# Patient Record
Sex: Female | Born: 1950 | Race: White | Hispanic: No
Health system: Midwestern US, Academic
[De-identification: ages and names within clinical notes are randomized; demographics above are authoritative.]

---

## 2016-09-14 LAB — LIPID PROFILE: Lab: 195

## 2016-09-14 LAB — COMPREHENSIVE METABOLIC PANEL: Lab: 140

## 2016-09-14 LAB — THYROID STIMULATING HORMONE-TSH: Lab: 2.3

## 2016-09-14 LAB — HEMOGLOBIN A1C: Lab: 7.9 — ABNORMAL HIGH (ref 4.5–6.5)

## 2016-11-13 LAB — BNP (B-TYPE NATRIURETIC PEPTI): Lab: 85 — ABNORMAL HIGH (ref 80–115)

## 2016-11-13 LAB — TROPONIN-I

## 2016-11-13 LAB — COMPREHENSIVE METABOLIC PANEL
Lab: 139 — ABNORMAL HIGH (ref <100)
Lab: 21 — ABNORMAL HIGH (ref 0–14)

## 2016-11-14 ENCOUNTER — Encounter: Admit: 2016-11-14 | Discharge: 2016-11-14 | Payer: MEDICARE

## 2016-11-14 ENCOUNTER — Ambulatory Visit: Admit: 2016-11-14 | Discharge: 2016-11-15 | Payer: MEDICARE

## 2016-11-14 DIAGNOSIS — I4891 Unspecified atrial fibrillation: Principal | ICD-10-CM

## 2016-11-16 ENCOUNTER — Encounter: Admit: 2016-11-16 | Discharge: 2016-11-16 | Payer: MEDICARE

## 2016-11-16 LAB — BASIC METABOLIC PANEL
Lab: 1.6 — ABNORMAL HIGH (ref 0.57–1.11)
Lab: 10 — ABNORMAL HIGH (ref 8.4–10.2)
Lab: 106 — ABNORMAL HIGH (ref 0.57–1.11)
Lab: 141 — ABNORMAL LOW (ref 23–31)
Lab: 18 — ABNORMAL HIGH (ref 0–14)
Lab: 184 — ABNORMAL HIGH (ref 80–115)
Lab: 21 — ABNORMAL LOW (ref 23–31)
Lab: 31 — ABNORMAL HIGH (ref 9.8–20.1)
Lab: 4.1 — ABNORMAL HIGH (ref 9.8–20.1)

## 2016-11-16 LAB — CBC: Lab: 6.5

## 2016-11-28 ENCOUNTER — Encounter: Admit: 2016-11-28 | Discharge: 2016-11-28 | Payer: MEDICARE

## 2016-12-05 ENCOUNTER — Encounter: Admit: 2016-12-05 | Discharge: 2016-12-05 | Payer: MEDICARE

## 2016-12-07 ENCOUNTER — Encounter: Admit: 2016-12-07 | Discharge: 2016-12-07 | Payer: MEDICARE

## 2016-12-07 DIAGNOSIS — Z8679 Personal history of other diseases of the circulatory system: ICD-10-CM

## 2016-12-07 DIAGNOSIS — E119 Type 2 diabetes mellitus without complications: Principal | ICD-10-CM

## 2016-12-07 DIAGNOSIS — I10 Essential (primary) hypertension: ICD-10-CM

## 2016-12-07 DIAGNOSIS — Z8673 Personal history of transient ischemic attack (TIA), and cerebral infarction without residual deficits: ICD-10-CM

## 2016-12-14 ENCOUNTER — Encounter: Admit: 2016-12-14 | Discharge: 2016-12-14 | Payer: MEDICARE

## 2016-12-14 ENCOUNTER — Ambulatory Visit: Admit: 2016-12-14 | Discharge: 2016-12-15 | Payer: MEDICARE

## 2016-12-14 DIAGNOSIS — Z8679 Personal history of other diseases of the circulatory system: ICD-10-CM

## 2016-12-14 DIAGNOSIS — Z8673 Personal history of transient ischemic attack (TIA), and cerebral infarction without residual deficits: ICD-10-CM

## 2016-12-14 DIAGNOSIS — E119 Type 2 diabetes mellitus without complications: Principal | ICD-10-CM

## 2016-12-14 DIAGNOSIS — I483 Typical atrial flutter: Principal | ICD-10-CM

## 2016-12-14 DIAGNOSIS — I10 Essential (primary) hypertension: ICD-10-CM

## 2016-12-14 DIAGNOSIS — I4892 Unspecified atrial flutter: ICD-10-CM

## 2016-12-14 MED ORDER — AMIODARONE 200 MG PO TAB
ORAL_TABLET | Freq: Every day | ORAL | 3 refills | 42.00000 days | Status: AC
Start: 2016-12-14 — End: ?

## 2016-12-14 NOTE — Assessment & Plan Note
Her tachycardia is entirely asymptomatic but should not be allowed to persist because it will eventually result in left ventricular dysfunction.  She could be started on oral amiodarone at this point but I will need to use a cardiac monitor to be able to determine what her rhythm is over the next couple of weeks.  Fortunately, she seems to be very compliant with her medication administration and does not seem to have missed any doses of anticoagulation.

## 2016-12-14 NOTE — Progress Notes
Date of Service: 12/14/2016    Audrey Hancock is a 66 y.o. female.       HPI     Audrey Hancock was in the [place] office today with a friend who helps somewhat as a caregiver.  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  Audrey Hancock's friends indicates that the patient is very compliant about taking all her medications as prescribed.    I saw Audrey Hancock while she was hospitalized at [facility] last month for paroxysmal atrial fibrillation/flutter.  She was entirely asymptomatic with the dysrhythmia and it had been picked up during a routine office visit.  She was started on oral anticoagulation at that time and her diltiazem dosage was increased somewhat.    Since that time, Audrey Hancock says she has been doing fine she denies any problems with lightheadedness or palpitations.  She had no chest discomfort or breathlessness.  She denies peripheral edema.  Her appetite has been good and she sleeps soundly.  She is entirely unaware of the fact that her heart rate is 160 today.         Vitals:    12/14/16 0906   BP: 92/66   Pulse: (!) 161   Weight: 63.1 kg (139 lb 3.2 oz)   Height: 1.6 m (5' 3)     Body mass index is 24.66 kg/m???.     Past Medical History  Patient Active Problem List    Diagnosis Date Noted   ??? Paroxysmal atrial flutter (HCC) 12/07/2016     11/14/2016  Echo : LV function is normal. EF~ 55%. Aortic valve sclerosis without stenosis; mild AI noted. No significant pericardial effusion. Normal ventricular chamber dimensions. Left atrial dilation. Normal LV wall thickness.  Abnormal diastolic function. Normal aortic root dimensions.  Estimated peak systolic PA pressure =  20 mmHg      ??? DM (diabetes mellitus) (HCC) 12/07/2016   ??? Hypertension 12/07/2016   ??? 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 History of CVA (cerebrovascular accident) 12/07/2016         Review of Systems Constitution: Positive for weakness.   HENT: Negative.    Eyes: Negative.    Cardiovascular: Positive for claudication.   Respiratory: Negative.    Endocrine: Negative.    Hematologic/Lymphatic: Negative.    Skin: Negative.    Musculoskeletal: Positive for falls.   Gastrointestinal: Negative.    Genitourinary: Negative.    Neurological: Positive for loss of balance.   Psychiatric/Behavioral: Negative.    Allergic/Immunologic: Negative.        Physical Exam    Physical Exam   General Appearance: no distress   Skin: warm, no ulcers or xanthomas   Digits and Nails: no cyanosis or clubbing   Eyes: conjunctivae and lids normal, pupils are equal and round   Teeth/Gums/Palate: dentition unremarkable, no lesions   Lips & Oral Mucosa: no pallor or cyanosis   Neck Veins: normal JVP , neck veins are not distended   Thyroid: no nodules, masses, tenderness or enlargement   Chest Inspection: chest is normal in appearance   Respiratory Effort: breathing comfortably, no respiratory distress   Auscultation/Percussion: lungs clear to auscultation, no rales or rhonchi, no wheezing   PMI: PMI not enlarged or displaced   Cardiac Rhythm: regular rhythm and fast rate   Cardiac Auscultation: S1, S2 normal, no rub, no gallop   Murmurs: no murmur  Peripheral Circulation: normal peripheral circulation   Carotid Arteries: normal carotid upstroke bilaterally, no bruits   Radial Arteries: normal symmetric radial pulses   Abdominal Aorta: no abdominal aortic bruit   Pedal Pulses: normal symmetric pedal pulses   Lower Extremity Edema: no lower extremity edema   Abdominal Exam: soft, non-tender, no masses, bowel sounds normal   Liver & Spleen: no organomegaly   Gait & Station: walks without assistance   Muscle Strength: normal muscle tone   Orientation: oriented to time, place and person   Affect & Mood: appropriate and sustained affect   Language and Memory: patient responsive and seems to comprehend information Neurologic Exam: neurological assessment grossly intact   Other: moves all extremities      Cardiovascular Studies    EKG:  Typical atrial flutter, ventricular rate 160.    Problems Addressed Today  Encounter Diagnoses   Name Primary?   ??? Typical atrial flutter (HCC) Yes   ??? Paroxysmal atrial flutter (HCC)        Assessment and Plan       Paroxysmal atrial flutter (HCC)  Her tachycardia is entirely asymptomatic but should not be allowed to persist because it will eventually result in left ventricular dysfunction.  She could be started on oral amiodarone at this point but I will need to use a cardiac monitor to be able to determine what her rhythm is over the next couple of weeks.  Fortunately, she seems to be very compliant with her medication administration and does not seem to have missed any doses of anticoagulation.      Current Medications (including today's revisions)  ??? amiodarone (CORDARONE) 200 mg tablet Take four tablets by mouth daily for 7 days, THEN three tablets daily for 7 days, THEN two tablets daily for 14 days, THEN one tablet daily. Take with food.   ??? apixaban (ELIQUIS) 5 mg tablet Take 5 mg by mouth twice daily.   ??? aspirin EC 81 mg tablet Take 81 mg by mouth daily. Take with food.   ??? canagliflozin (INVOKANA) 100 mg tablet Take 100 mg by mouth daily with breakfast.   ??? clotrimazole (LOTRIMIN) 1 % topical cream Apply 15 g topically to affected area twice daily.   ??? dapagliflozin 5 mg tab Take 1 tablet by mouth daily.   ??? diltiazem CD (CARDIZEM CD) 240 mg capsule Take 240 mg by mouth daily.   ??? famotidine (PEPCID) 20 mg tablet Take 20 mg by mouth twice daily.   ??? glipiZIDE (GLUCOTROL) 10 mg tablet Take 10 mg by mouth twice daily.   ??? levothyroxine (SYNTHROID) 25 mcg tablet Take 25 mcg by mouth daily 30 minutes before breakfast.   ??? loratadine (CLARITIN) 10 mg tablet Take 10 mg by mouth every morning.   ??? metFORMIN (GLUCOPHAGE) 1,000 mg tablet Take 1,000 mg by mouth twice daily with meals. ??? metoprolol XL (TOPROL XL) 50 mg extended release tablet Take 50 mg by mouth daily.   ??? MULTIVITAMIN PO Take 1 tablet by mouth daily.   ??? NIFEdipine SR (PROCARDIA-XL; ADALAT CC) 60 mg tablet Take 60 mg by mouth daily.   ??? nystatin (NYSTOP) 100,000 unit/g topical powder Apply  topically to affected area twice daily.   ??? ramipril (ALTACE) 2.5 mg capsule Take 2.5 mg by mouth daily.

## 2016-12-14 NOTE — Progress Notes
Holter Placement Record  Ordering Physician: Dr. Danella MaiersSteven Hancock  Diagnosis: Atrial Fibrillation/ Atrial Flutter  Brand:Ziopatch Irhythm  Length: 14 days  Ziopatch serial number: [id number]  Location where Holter was placed: [facility] [place] [facility]  Will Holter be returned by mail? Yes

## 2016-12-14 NOTE — Progress Notes
Enrollment Confirmed Irhythm

## 2016-12-28 ENCOUNTER — Encounter: Admit: 2016-12-28 | Discharge: 2016-12-28 | Payer: MEDICARE

## 2016-12-28 NOTE — Telephone Encounter
Pt called to report that her Ziopatch is to come off today and she has no ride to the office. Pt will mail it in.

## 2017-01-25 ENCOUNTER — Encounter: Admit: 2017-01-25 | Discharge: 2017-01-25 | Payer: MEDICARE

## 2017-02-20 ENCOUNTER — Encounter: Admit: 2017-02-20 | Discharge: 2017-02-20 | Payer: MEDICARE

## 2017-02-20 LAB — COMPREHENSIVE METABOLIC PANEL
Lab: 0.4
Lab: 12
Lab: 17
Lab: 17 — ABNORMAL HIGH (ref 0–14)
Lab: 25
Lab: 3 — ABNORMAL LOW (ref 3.4–4.8)
Lab: 9.5
Lab: 96
Lab: 135 — ABNORMAL LOW (ref 136–145)
Lab: 253 — ABNORMAL HIGH (ref 80–115)
Lab: 7.3 — ABNORMAL HIGH (ref 11.5–14.5)

## 2017-02-20 LAB — THYROID STIMULATING HORMONE-TSH: Lab: 6.6 — ABNORMAL HIGH (ref 0.35–4.94)

## 2017-02-20 LAB — FREE T4 (FREE THYROXINE) ONLY: Lab: 1.1 — ABNORMAL LOW (ref 23–31)

## 2017-02-20 LAB — BNP (B-TYPE NATRIURETIC PEPTI): Lab: 859 — ABNORMAL HIGH (ref 0–100)

## 2017-02-20 LAB — TROPONIN-I

## 2017-02-20 LAB — CBC: Lab: 11 — ABNORMAL HIGH (ref 4.8–10.8)

## 2017-02-24 LAB — CBC: Lab: 5.7

## 2017-02-25 LAB — MAGNESIUM: Lab: 1.6 — ABNORMAL LOW (ref 12.0–16.0)

## 2017-02-26 LAB — BASIC METABOLIC PANEL
Lab: 1.5 — ABNORMAL HIGH (ref 0.40–1.10)
Lab: 101 — ABNORMAL LOW (ref 35.0–45.0)
Lab: 138 — ABNORMAL LOW (ref 4.20–5.40)
Lab: 30 — ABNORMAL HIGH (ref 7–23)
Lab: 32 — ABNORMAL HIGH (ref 21–30)
Lab: 33 — ABNORMAL LOW (ref >60)
Lab: 39 — ABNORMAL LOW (ref >60)
Lab: 5
Lab: 78
Lab: 8.5 — ABNORMAL LOW (ref 9.0–10.7)

## 2017-02-28 ENCOUNTER — Encounter: Admit: 2017-02-28 | Discharge: 2017-02-28 | Payer: MEDICARE

## 2017-03-05 ENCOUNTER — Encounter: Admit: 2017-03-05 | Discharge: 2017-03-05 | Payer: MEDICARE

## 2017-03-05 DIAGNOSIS — Z79899 Other long term (current) drug therapy: ICD-10-CM

## 2017-03-05 DIAGNOSIS — Z8679 Personal history of other diseases of the circulatory system: ICD-10-CM

## 2017-03-05 DIAGNOSIS — I4892 Unspecified atrial flutter: Principal | ICD-10-CM

## 2017-03-19 LAB — COMPREHENSIVE METABOLIC PANEL
Lab: 0.4
Lab: 141 — ABNORMAL LOW (ref 4.20–5.40)
Lab: 16
Lab: 16 — ABNORMAL HIGH (ref 0–14)
Lab: 17
Lab: 3.2 — ABNORMAL LOW (ref 3.4–4.8)
Lab: 33
Lab: 6.4
Lab: 70

## 2017-03-19 LAB — FREE T4 (FREE THYROXINE) ONLY: Lab: 1.2

## 2017-03-19 LAB — LIPID PROFILE
Lab: 250 — ABNORMAL HIGH (ref 150–200)
Lab: 29 — ABNORMAL HIGH (ref 0.57–1.11)
Lab: 5

## 2017-03-19 LAB — THYROID STIMULATING HORMONE-TSH: Lab: 9.1 — ABNORMAL HIGH (ref 0.35–4.94)

## 2017-04-06 ENCOUNTER — Encounter: Admit: 2017-04-06 | Discharge: 2017-04-06 | Payer: MEDICARE

## 2017-04-30 LAB — CBC: Lab: 5

## 2017-05-14 LAB — THYROID STIMULATING HORMONE-TSH: Lab: 7.7 — ABNORMAL HIGH (ref 0.35–4.94)

## 2017-05-15 ENCOUNTER — Encounter: Admit: 2017-05-15 | Discharge: 2017-05-15 | Payer: MEDICARE

## 2017-05-31 ENCOUNTER — Encounter: Admit: 2017-05-31 | Discharge: 2017-05-31 | Payer: MEDICARE

## 2017-05-31 ENCOUNTER — Ambulatory Visit: Admit: 2017-05-31 | Discharge: 2017-06-01 | Payer: MEDICARE

## 2017-05-31 DIAGNOSIS — I4892 Unspecified atrial flutter: Principal | ICD-10-CM

## 2017-05-31 DIAGNOSIS — Z8673 Personal history of transient ischemic attack (TIA), and cerebral infarction without residual deficits: ICD-10-CM

## 2017-05-31 DIAGNOSIS — E119 Type 2 diabetes mellitus without complications: Principal | ICD-10-CM

## 2017-05-31 DIAGNOSIS — I10 Essential (primary) hypertension: ICD-10-CM

## 2017-05-31 DIAGNOSIS — Z8679 Personal history of other diseases of the circulatory system: ICD-10-CM

## 2017-11-02 ENCOUNTER — Encounter: Admit: 2017-11-02 | Discharge: 2017-11-02 | Payer: MEDICARE

## 2017-11-02 DIAGNOSIS — I4892 Unspecified atrial flutter: Principal | ICD-10-CM

## 2017-11-02 DIAGNOSIS — Z79899 Other long term (current) drug therapy: ICD-10-CM

## 2018-01-01 ENCOUNTER — Encounter: Admit: 2018-01-01 | Discharge: 2018-01-01 | Payer: MEDICARE

## 2018-01-07 LAB — LIVER FUNCTION PANEL
Lab: 0.2
Lab: 0.4
Lab: 131
Lab: 22
Lab: 3.1 — ABNORMAL LOW (ref 3.4–4.8)
Lab: 37
Lab: 5.9 — ABNORMAL LOW (ref 6.2–8.1)

## 2018-01-07 LAB — TSH WITH FREE T4 REFLEX: Lab: 5.5 — ABNORMAL HIGH (ref 0.35–4.94)

## 2018-01-08 ENCOUNTER — Encounter: Admit: 2018-01-08 | Discharge: 2018-01-08 | Payer: MEDICARE

## 2018-01-08 ENCOUNTER — Ambulatory Visit: Admit: 2018-01-08 | Discharge: 2018-01-08 | Payer: MEDICARE

## 2018-01-08 DIAGNOSIS — E119 Type 2 diabetes mellitus without complications: Secondary | ICD-10-CM

## 2018-01-08 DIAGNOSIS — Z8673 Personal history of transient ischemic attack (TIA), and cerebral infarction without residual deficits: Secondary | ICD-10-CM

## 2018-01-08 DIAGNOSIS — I10 Essential (primary) hypertension: Secondary | ICD-10-CM

## 2018-01-08 DIAGNOSIS — Z8679 Personal history of other diseases of the circulatory system: Secondary | ICD-10-CM

## 2018-01-08 DIAGNOSIS — I4892 Unspecified atrial flutter: Secondary | ICD-10-CM

## 2018-01-10 ENCOUNTER — Encounter: Admit: 2018-01-10 | Discharge: 2018-01-10 | Payer: MEDICARE

## 2018-01-10 DIAGNOSIS — I4892 Unspecified atrial flutter: Secondary | ICD-10-CM

## 2018-01-10 DIAGNOSIS — Z79899 Other long term (current) drug therapy: Secondary | ICD-10-CM

## 2018-07-03 ENCOUNTER — Encounter: Admit: 2018-07-03 | Discharge: 2018-07-03

## 2018-07-03 DIAGNOSIS — I4892 Unspecified atrial flutter: Secondary | ICD-10-CM

## 2018-07-03 DIAGNOSIS — Z79899 Other long term (current) drug therapy: Secondary | ICD-10-CM

## 2018-07-03 NOTE — Telephone Encounter
On chart review it appears pt's original /rx for amiodarone had an end date of 01/11/18 and was therefore not on med list. I sdpoke to Joe, [facility], who confirmed pt is still taking amio 200mg  daily. Faxed orders to him at [phone] for f/u labs due at this time.    ----- Message -----  From: Baldwin Crown, RN  Sent: 07/03/2018  To: [facility]  Subject: due for 6 month amio monitoring                  Due for labs - last cxr 01/08/2018.

## 2018-07-03 NOTE — Telephone Encounter
-----   Message -----  From: Rosaria Ferries, LPN  Sent: 09/07/6211   4:10 PM CDT  To: Orlin Hilding, RN  Subject: RE: fax lab orders please                        Lab orders faxed to [place]. Thank you!    ----- Message -----  From: Orlin Hilding, RN  Sent: 07/03/2018   4:00 PM CDT  To: Rosaria Ferries, LPN  Subject: fax lab orders please                            Please fax the amio f/u lab reqs entered 7/1 (LFTs and TSH reflex free T4) to [facility] fax at [phone]. Thanks!

## 2018-07-05 LAB — LIVER FUNCTION PANEL
Lab: 0.4
Lab: 103
Lab: 21
Lab: 37
Lab: 6.7

## 2018-07-05 LAB — TSH WITH FREE T4 REFLEX
Lab: 1.5 — ABNORMAL HIGH (ref 0.70–1.48)
Lab: 4

## 2018-07-08 ENCOUNTER — Encounter: Admit: 2018-07-08 | Discharge: 2018-07-08

## 2018-07-08 DIAGNOSIS — I4892 Unspecified atrial flutter: Secondary | ICD-10-CM

## 2018-07-08 DIAGNOSIS — Z79899 Other long term (current) drug therapy: Secondary | ICD-10-CM

## 2018-07-08 NOTE — Telephone Encounter
Amiodarone Monitoring status as of 07/08/18:     Amiodarone monitoring complete.  Next amiodarone review is due in 180 days.    Most recent lab results  Lab Results   Component Value Date/Time    AST 21 07/05/2018    ALT 37 07/05/2018    TSH 4.09 07/05/2018    FREET4R 1.23 03/19/2017         Procedures  Procedures  Last chest X-Ray: 01/08/2018           Last PFT: "unable to complete, okay per Dr. Ricard Dillon" noted on 11/02/17 amio f/u

## 2019-12-19 ENCOUNTER — Encounter: Admit: 2019-12-19 | Discharge: 2019-12-19 | Payer: MEDICARE

## 2019-12-19 ENCOUNTER — Ambulatory Visit: Admit: 2019-12-19 | Discharge: 2019-12-19 | Payer: MEDICARE

## 2019-12-19 DIAGNOSIS — R778 Other specified abnormalities of plasma proteins: Secondary | ICD-10-CM

## 2019-12-19 DIAGNOSIS — I959 Hypotension, unspecified: Secondary | ICD-10-CM

## 2020-01-03 DEATH — deceased

## 2021-08-29 IMAGING — CR CHEST
1 series · 1 of 1 positions shown · non-contrast
Comparison: none

[chest port x-wise]
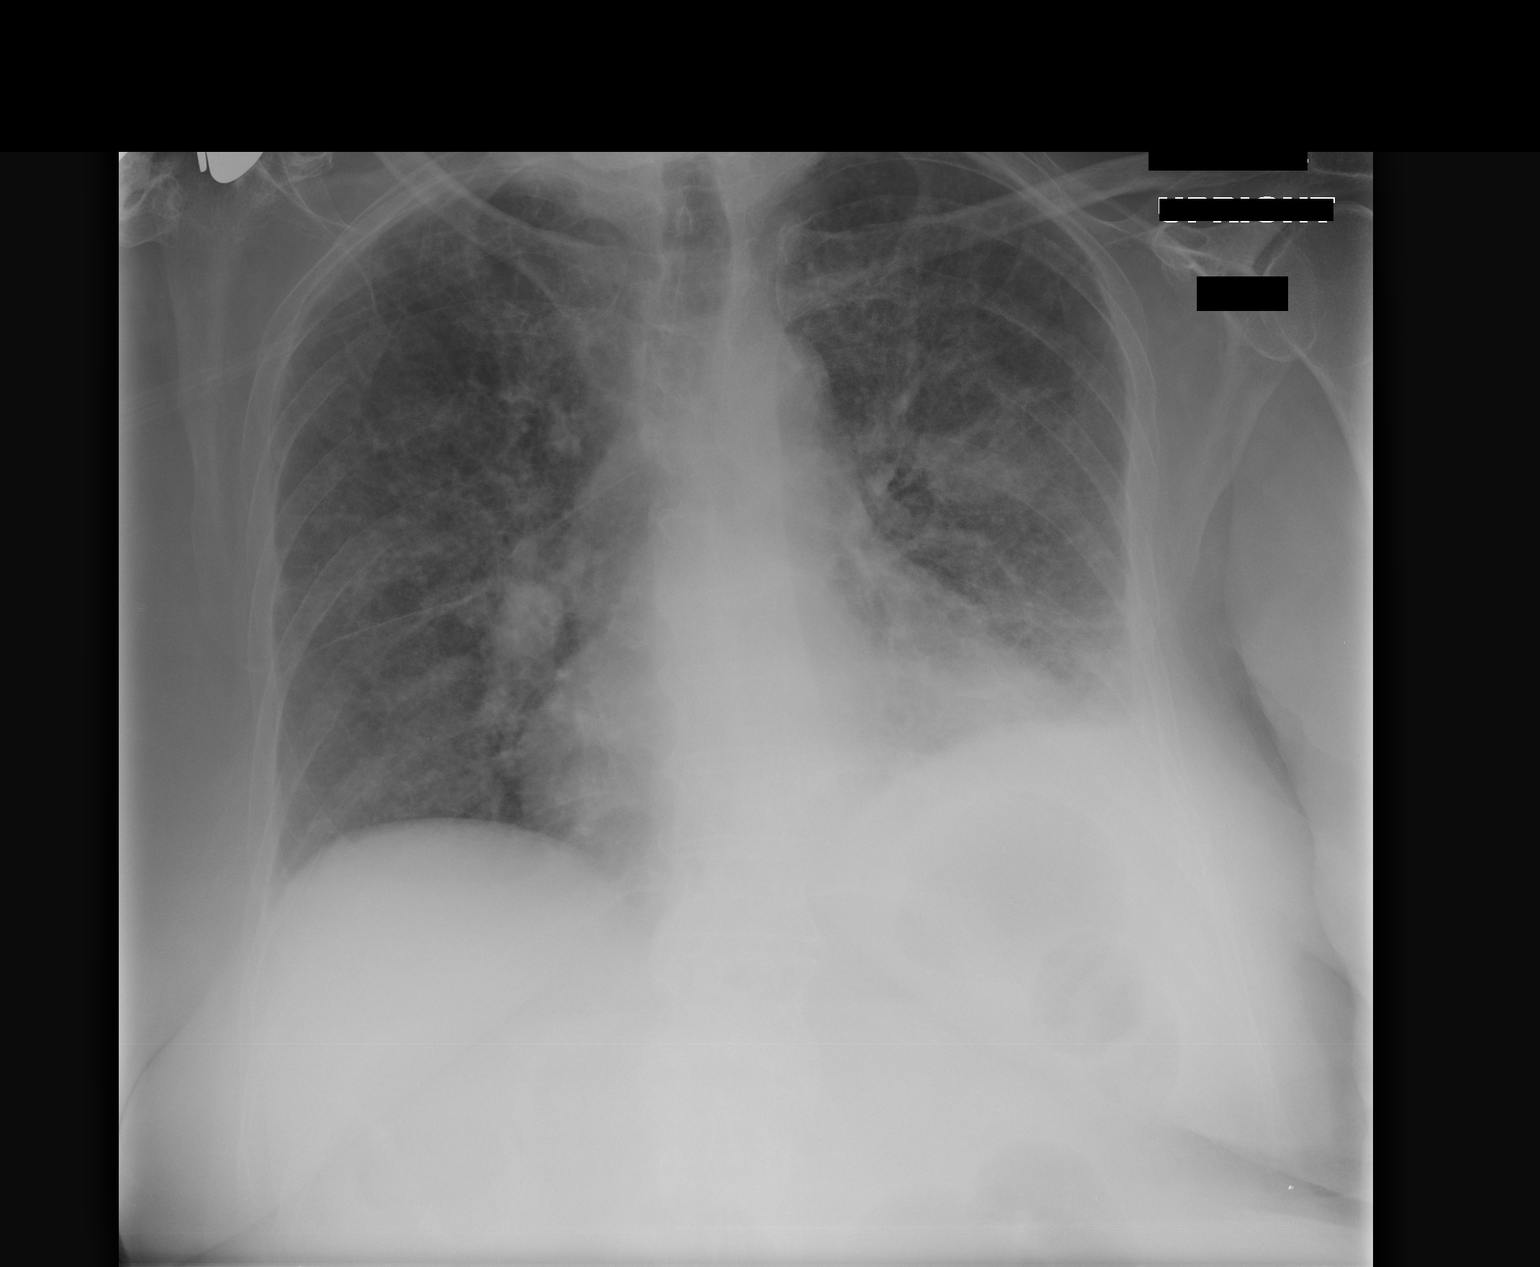

[1 of 1 positions shown; findings below may reference images not displayed]

DIAGNOSTIC STUDIES

EXAM

XR chest 1V

INDICATION

o2 by [HOSPITAL] oral lesions
SOA TM

TECHNIQUE

Portable AP chest.

COMPARISONS

September 14, 2019

FINDINGS

Cardiac silhouette is u[HOSPITAL]hanged. Bilateral pulmonary infiltrates are seen most consistent atypical
pneumonitis i[HOSPITAL]luding viral pneumonia. Small left pleural effusion is noted. Osseous structures are
u[HOSPITAL]hanged.

IMPRESSION

Bilateral pulmonary infiltrates co[HOSPITAL]erning for infectious or inflammatory pneumonitis i[HOSPITAL]luding
viral pneumonia.

Probable small left pleural effusion.

Tech Notes:

SOA TM
